# Patient Record
Sex: Female | Born: 2000 | Race: White | Hispanic: No | Marital: Single | Smoking: Current every day smoker
Health system: Southern US, Community
[De-identification: ages and names within clinical notes are randomized; demographics above are authoritative.]

## PROBLEM LIST (undated history)

## (undated) DIAGNOSIS — N926 Irregular menstruation, unspecified: Secondary | ICD-10-CM

## (undated) DIAGNOSIS — L709 Acne, unspecified: Secondary | ICD-10-CM

## (undated) HISTORY — DX: Irregular menstruation, unspecified: N92.6

## (undated) HISTORY — PX: FOOT SURGERY: SHX648

## (undated) HISTORY — DX: Acne, unspecified: L70.9

## (undated) HISTORY — PX: WISDOM TOOTH EXTRACTION: SHX21

---
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
## 2021-03-15 ENCOUNTER — Ambulatory Visit: Payer: 59 | Admitting: Women's Health

## 2021-05-03 ENCOUNTER — Telehealth: Payer: Self-pay | Admitting: Women's Health

## 2021-05-03 NOTE — Telephone Encounter (Signed)
Returned pt's call for clarification. Two identifiers used. Pt aware that her mother called this morning. Pt stated that she did not take all of the antibiotic and Flagyl prescribed for Chlamydia and BV. Pt stated she felt that she was having symptoms again. Considering that this was resulted and prescribed in July 2022, pt was told to make an appt for a nurse visit self swab to recheck her. Pt agreed and appt made for 05/07/21. Pt confirmed understanding.

## 2021-05-03 NOTE — Telephone Encounter (Signed)
Patient's mother called and said that her daughter didn't take her medicines right that Ms. Booker prescribed her and now her stomach is back hurting. Please advise.

## 2021-05-07 ENCOUNTER — Other Ambulatory Visit (INDEPENDENT_AMBULATORY_CARE_PROVIDER_SITE_OTHER): Payer: 59

## 2021-05-07 ENCOUNTER — Other Ambulatory Visit (HOSPITAL_COMMUNITY)
Admission: RE | Admit: 2021-05-07 | Discharge: 2021-05-07 | Disposition: A | Discharge status: Home / Self Care | Payer: 59 | Source: Ambulatory Visit | Attending: Obstetrics & Gynecology | Admitting: Obstetrics & Gynecology

## 2021-05-07 ENCOUNTER — Other Ambulatory Visit: Payer: Self-pay

## 2021-05-07 DIAGNOSIS — N898 Other specified noninflammatory disorders of vagina: Secondary | ICD-10-CM | POA: Diagnosis present

## 2021-05-07 NOTE — Progress Notes (Addendum)
   NURSE VISIT- VAGINITIS/STD  SUBJECTIVE:  Charlene Brewer is a 20 y.o. G0P0000 GYN patientfemale here for a vaginal swab for vaginitis screening, STD screen.  She reports the following symptoms: discharge described as clear and pain for 1 month. Denies abnormal vaginal bleeding, significant pelvic pain, fever, or UTI symptoms.  OBJECTIVE:  There were no vitals taken for this visit.  Appears well, in no apparent distress  ASSESSMENT: Vaginal swab for  vaginitis & STD screening  PLAN: Self-collected vaginal probe for Gonorrhea, Chlamydia, Trichomonas, Bacterial Vaginosis, Yeast sent to lab Treatment: to be determined once results are received Follow-up as needed if symptoms persist/worsen, or new symptoms develop  Elazar Argabright A Kesler Wickham  05/07/2021 10:22 AM   Chart reviewed for nurse visit. Agree with plan of care.  Cheral Marker, [place] 05/07/2021 11:58 AM

## 2021-05-08 ENCOUNTER — Other Ambulatory Visit: Payer: Self-pay | Admitting: Women's Health

## 2021-05-08 LAB — CERVICOVAGINAL ANCILLARY ONLY
Bacterial Vaginitis (gardnerella): POSITIVE — AB
Candida Glabrata: NEGATIVE
Candida Vaginitis: NEGATIVE
Chlamydia: NEGATIVE
Comment: NEGATIVE
Comment: NEGATIVE
Comment: NEGATIVE
Comment: NEGATIVE
Comment: NEGATIVE
Comment: NORMAL
Neisseria Gonorrhea: NEGATIVE
Trichomonas: NEGATIVE

## 2021-05-08 MED ORDER — METRONIDAZOLE 500 MG PO TABS: 500.0000 mg | ORAL_TABLET | Freq: Two times a day (BID) | ORAL | 0 refills | Status: DC

## 2021-05-10 ENCOUNTER — Telehealth: Payer: Self-pay | Admitting: *Deleted

## 2021-05-10 NOTE — Telephone Encounter (Addendum)
Voice mail not set up @ 10:30 am. JSY  Noted, to continue trying Cheral Marker, CNM, [facility] 05/10/2021 12:34 PM

## 2021-05-10 NOTE — Telephone Encounter (Signed)
Pt aware swab came back + for BV and med was sent to pharmacy. Take med as directed. No sex or alcohol while on med. Pt voiced understanding. JSY

## 2021-08-19 ENCOUNTER — Other Ambulatory Visit: Payer: Self-pay

## 2021-08-19 ENCOUNTER — Ambulatory Visit (INDEPENDENT_AMBULATORY_CARE_PROVIDER_SITE_OTHER): Payer: 59 | Admitting: Adult Health

## 2021-08-19 ENCOUNTER — Encounter: Payer: Self-pay | Admitting: Adult Health

## 2021-08-19 VITALS — BP 105/59 | HR 74 | Ht 61.0 in | Wt 111.0 lb

## 2021-08-19 DIAGNOSIS — Z3202 Encounter for pregnancy test, result negative: Secondary | ICD-10-CM | POA: Diagnosis not present

## 2021-08-19 DIAGNOSIS — Z30011 Encounter for initial prescription of contraceptive pills: Secondary | ICD-10-CM | POA: Diagnosis not present

## 2021-08-19 DIAGNOSIS — Z113 Encounter for screening for infections with a predominantly sexual mode of transmission: Secondary | ICD-10-CM | POA: Diagnosis not present

## 2021-08-19 LAB — POCT URINE PREGNANCY: Preg Test, Ur: NEGATIVE

## 2021-08-19 MED ORDER — NORETHIN ACE-ETH ESTRAD-FE 1-20 MG-MCG(24) PO TABS: 1.0000 | ORAL_TABLET | Freq: Every day | ORAL | 11 refills | Status: DC

## 2021-08-19 NOTE — Progress Notes (Signed)
?  Subjective:  ?  ? Patient ID: Charlene Brewer, female   DOB: 04-Feb-2001, 21 y.o.   MRN: [id number] ? ?HPI ?Charlene Brewer is a 21 year old white female, single, G0P0 in requesting to get back on OCs she liked lo Loestrin but was too expensive, has tried th patch and did not like and has been on one other OC, not sure of name. ?PCP is Dr Theresia Bough Nogo. ? ?Review of Systems ?Patient denies any headaches, hearing loss, fatigue, blurred vision, shortness of breath, chest pain, abdominal pain, problems with bowel movements, urination, or intercourse. No joint pain or mood swings.  ?  Reviewed past medical,surgical, social and family history. Reviewed medications and allergies.  ?Objective:  ? Physical Exam ?BP (!) 105/59 (BP Location: Right Arm, Patient Position: Sitting, Cuff Size: Normal)   Pulse 74   Ht 5\' 1"  (1.549 m)   Wt 111 lb (50.3 kg)   LMP 08/12/2021 (Exact Date)   BMI 20.97 kg/m?  UPT is negative ?Skin warm and dry.  Lungs: clear to ausculation bilaterally. Cardiovascular: regular rate and rhythm.  ?   ? Upstream - 08/19/21 0858   ? ?  ? Pregnancy Intention Screening  ? Does the patient want to become pregnant in the next year? No   ? Does the patient's partner want to become pregnant in the next year? No   ? Would the patient like to discuss contraceptive options today? Yes   ?  ? Contraception Wrap Up  ? Current Method No Contraceptive Precautions   ? End Method Oral Contraceptive   ? Contraception Counseling Provided Yes   ? ?  ?  ? ?  ?  ?Assessment:  ?   ?1. Pregnancy test negative ? ? ?2. Screen for STD (sexually transmitted disease ?Urine sent for GC/CHL ? ?3. Encounter for initial prescription of contraceptive pills ?Will rx Loestrin 24 1/20,start today and use condoms for 1 pack  ?Meds ordered this encounter  ?Medications  ? Norethindrone Acetate-Ethinyl Estrad-FE (LOESTRIN 24 FE) 1-20 MG-MCG(24) tablet  ?  Sig: Take 1 tablet by mouth daily.  ?  Dispense:  28 tablet  ?  Refill:  11  ?  Order Specific  Question:   Supervising Provider  ?  Answer:   Tania Ade H [[id number]]  ?  ?   ?Plan:  ?   ?Follow up in 3 months  ?   ?

## 2021-08-21 LAB — GC/CHLAMYDIA PROBE AMP
Chlamydia trachomatis, NAA: NEGATIVE
Neisseria Gonorrhoeae by PCR: NEGATIVE

## 2021-11-19 ENCOUNTER — Ambulatory Visit: Payer: 59 | Admitting: Adult Health

## 2022-01-09 IMAGING — CT CT ABD-PELV W/ CM
2 of 4 series · 16 of 46 positions shown, 18 images · IV contrast (Omnipaque or Isovue)
Comparison: None.

CLINICAL DATA: Right lower quadrant abdominal pain

EXAM:
CT ABDOMEN AND PELVIS WITH CONTRAST
TECHNIQUE: Multidetector CT imaging of the abdomen and pelvis was performed
using the standard protocol following bolus administration of
intravenous contrast.
CONTRAST:  75mL OMNIPAQUE IOHEXOL 300 MG/ML  SOLN

[Series 2: axial st · axial · 0.76mm/px · z∈[-875,-485]mm · 13 of 86 slices shown, 15 images]
[im 4/86  soft-tissue]
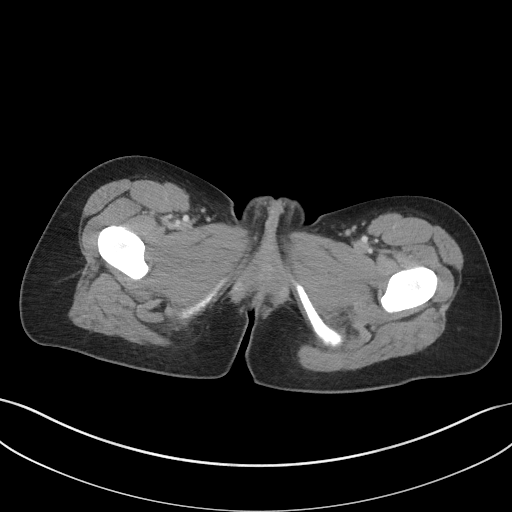
[im 4/86  bone]
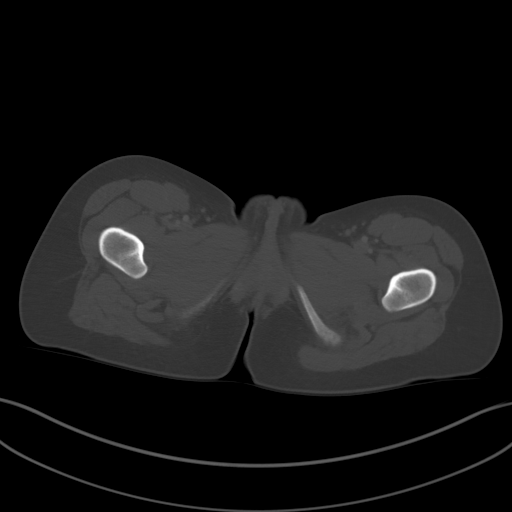
[im 11/86  soft-tissue]
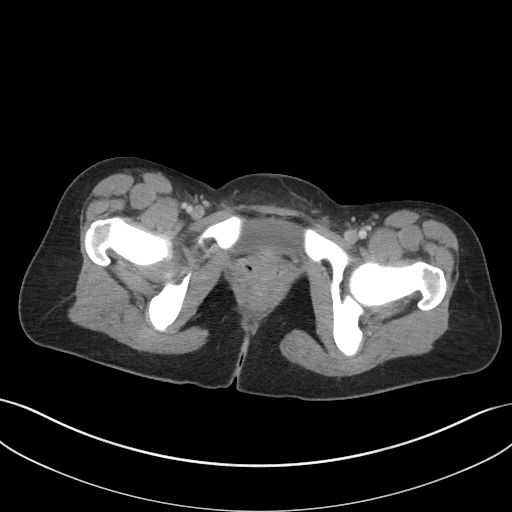
[im 18/86  soft-tissue]
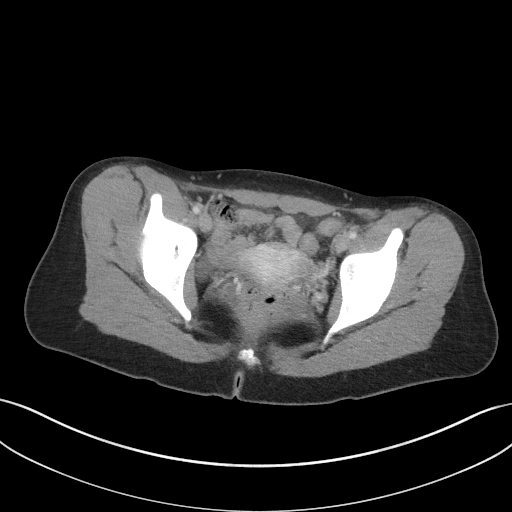
[im 24/86  soft-tissue]
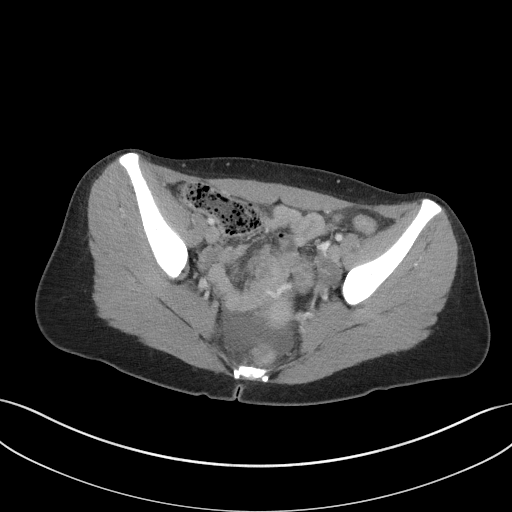
[im 31/86  soft-tissue]
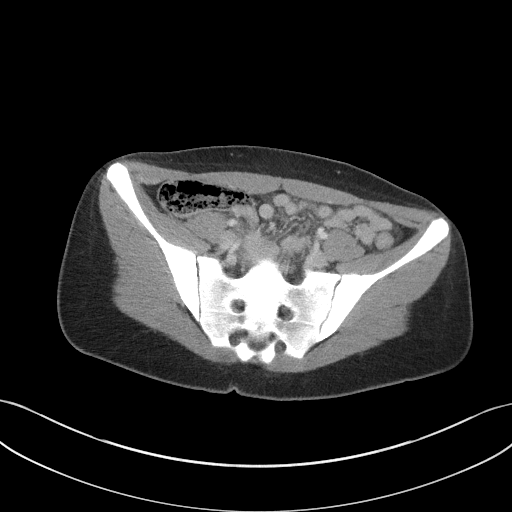
[im 38/86  soft-tissue]
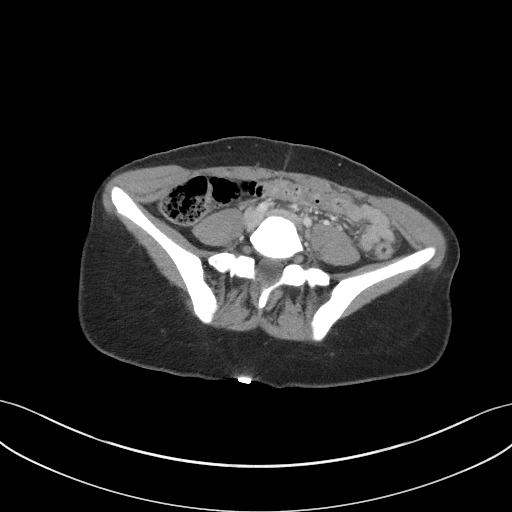
[im 45/86  soft-tissue]
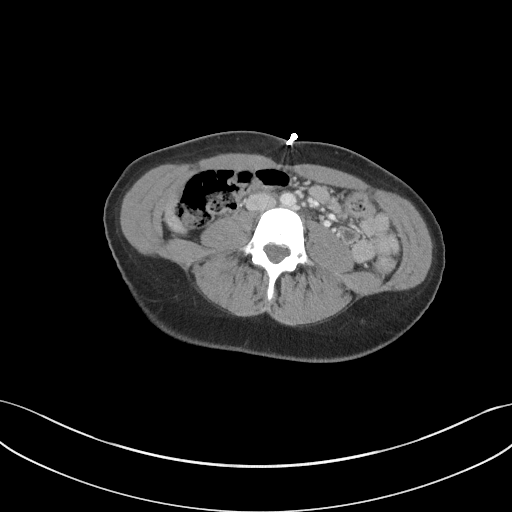
[im 48/86  soft-tissue]
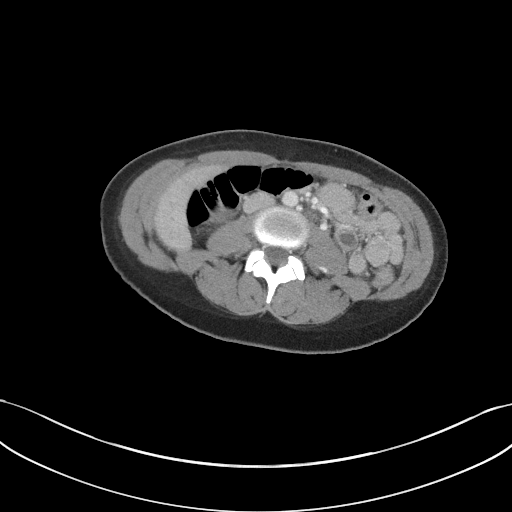
[im 55/86  soft-tissue]
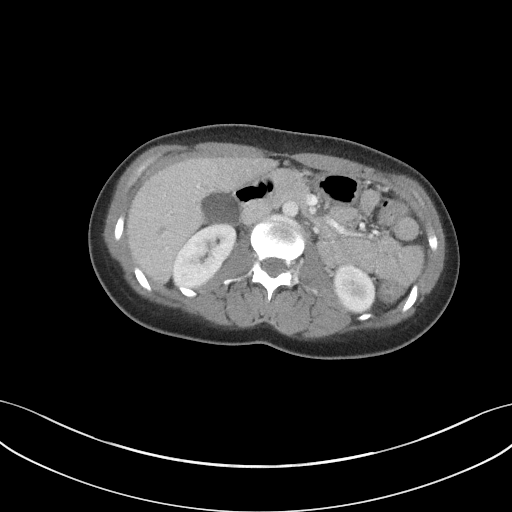
[im 55/86  bone]
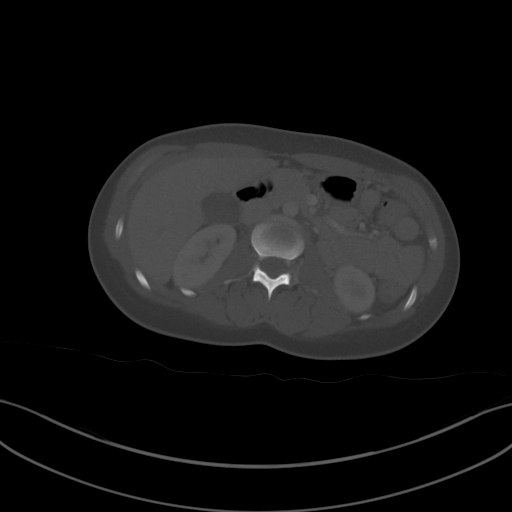
[im 62/86  soft-tissue]
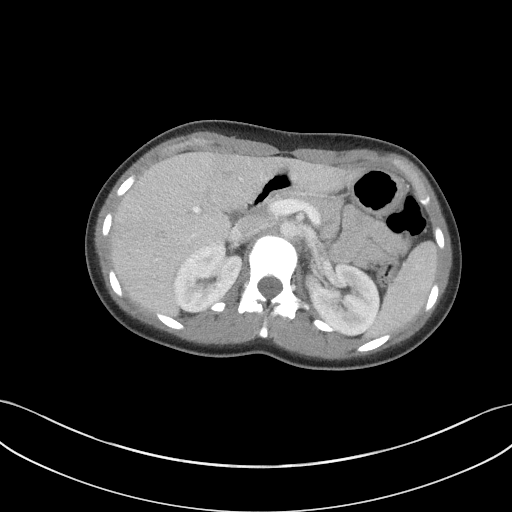
[im 69/86  soft-tissue]
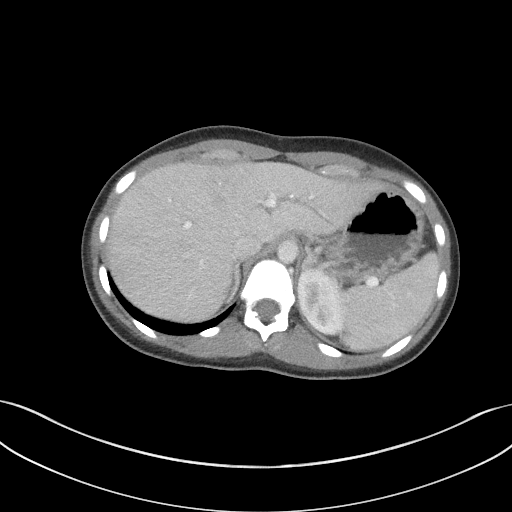
[im 75/86  soft-tissue]
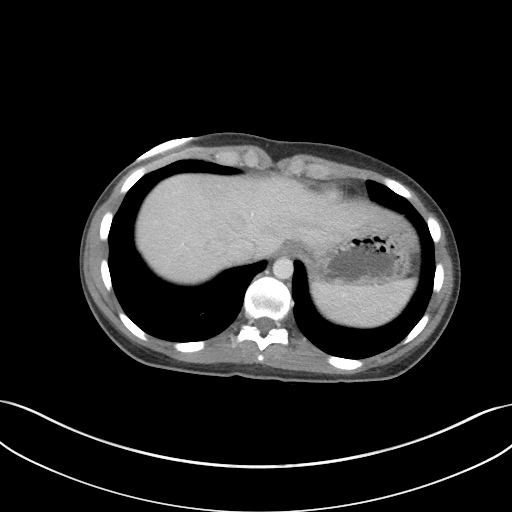
[im 82/86  soft-tissue]
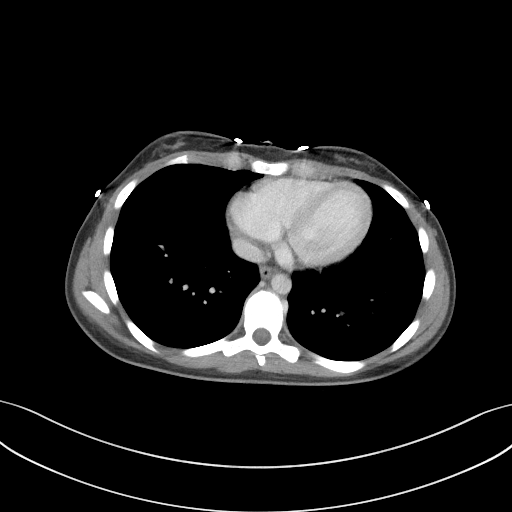

[Series 5: coronal st · coronal · 0.68mm/px · 3 of 82 slices shown]
[im 28/82  soft-tissue]
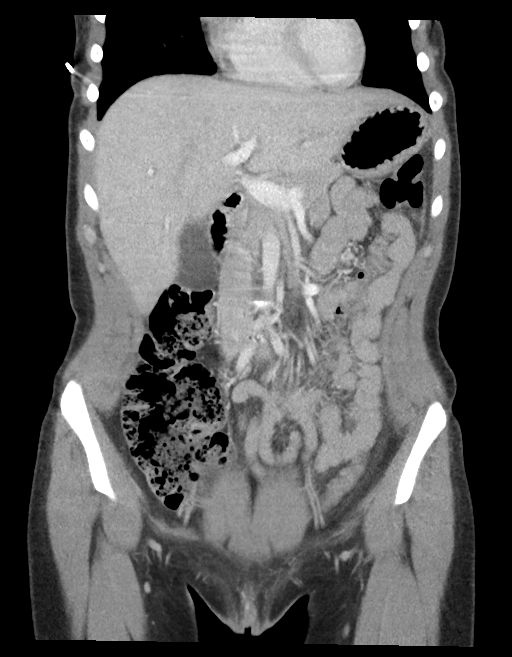
[im 37/82  soft-tissue]
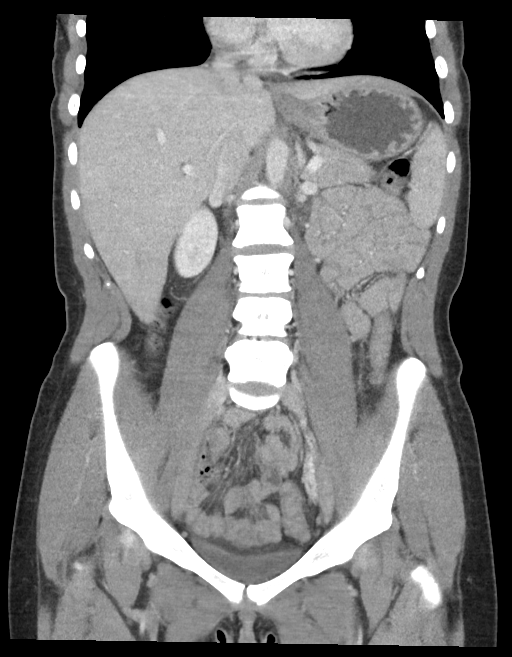
[im 46/82  soft-tissue]
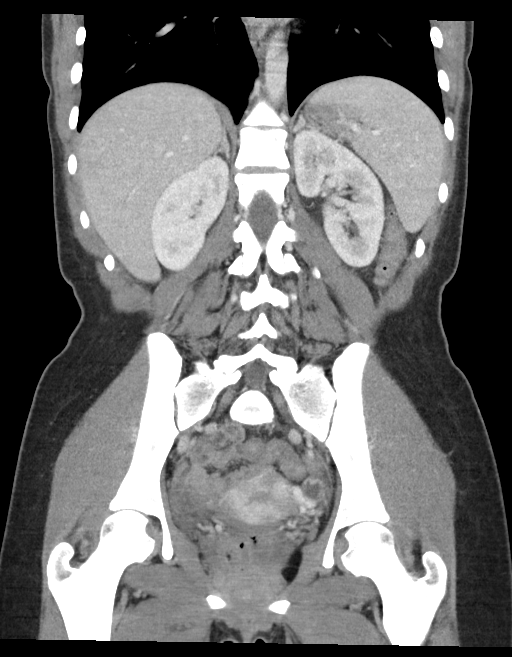

[16 of 46 positions shown; findings below may reference images not displayed]

FINDINGS: Lower chest: No acute abnormality.

Hepatobiliary: No focal liver abnormality is seen. No gallstones,
gallbladder wall thickening, or biliary dilatation.

Pancreas: Unremarkable. No pancreatic ductal dilatation or
surrounding inflammatory changes.

Spleen: Normal in size without focal abnormality.

Adrenals/Urinary Tract: Normal adrenal glands. Tiny cyst within
posterior cortex of left kidney measures 6 mm. No mass or
hydronephrosis identified. Urinary bladder appears normal for degree
of distension.

Stomach/Bowel: Normal stomach. Portions of the appendix are
identified within the right side of pelvis, image 39/5. No signs of
appendiceal thickening or inflammation. No bowel wall thickening,
inflammation, or distension.

Vascular/Lymphatic: No significant vascular findings are present. No
enlarged abdominal or pelvic lymph nodes.

Reproductive: Uterus appears normal. Left ovary corpus luteum, image
65/2. Prominent bilateral parametrial veins identified, left greater
than right.

Other: Small volume of free fluid noted within the dependent portion
of the pelvis, image 63/2. No focal fluid collections.

Musculoskeletal: No acute or significant osseous findings.
IMPRESSION: 1. No acute findings identified within the abdomen or pelvis. The
appendix is visualized and appears normal.
2. Small volume of free fluid noted within the dependent portion of
the pelvis. Nonspecific in a premenopausal female.
3. Prominent bilateral parametrial veins, left greater than right.
Correlate for any clinical signs or symptoms of pelvic congestion
syndrome.

## 2022-05-19 ENCOUNTER — Telehealth: Payer: Self-pay

## 2022-05-19 MED ORDER — NORETHIN ACE-ETH ESTRAD-FE 1-20 MG-MCG(24) PO TABS: 1.0000 | ORAL_TABLET | Freq: Every day | ORAL | 11 refills | Status: DC

## 2022-05-19 NOTE — Telephone Encounter (Signed)
Pt needs a refill on birth control. Thanks! JSY 

## 2022-05-19 NOTE — Telephone Encounter (Signed)
Refill sent.

## 2022-05-19 NOTE — Telephone Encounter (Signed)
Patient stated that she was in a few months ago and was given birth control to see if she liked them. She does and wants a refill

## 2022-06-15 ENCOUNTER — Other Ambulatory Visit: Payer: Self-pay | Admitting: Adult Health

## 2023-04-20 ENCOUNTER — Other Ambulatory Visit: Payer: Self-pay | Admitting: Adult Health

## 2023-05-22 ENCOUNTER — Ambulatory Visit
Admission: EM | Admit: 2023-05-22 | Discharge: 2023-05-22 | Disposition: A | Discharge status: Home / Self Care | Payer: 59

## 2023-05-22 DIAGNOSIS — J0100 Acute maxillary sinusitis, unspecified: Secondary | ICD-10-CM | POA: Diagnosis not present

## 2023-05-22 MED ORDER — PROMETHAZINE-DM 6.25-15 MG/5ML PO SYRP: 5.0000 mL | ORAL_SOLUTION | Freq: Four times a day (QID) | ORAL | 0 refills | Status: AC | PRN

## 2023-05-22 MED ORDER — AMOXICILLIN-POT CLAVULANATE 875-125 MG PO TABS: 1.0000 | ORAL_TABLET | Freq: Two times a day (BID) | ORAL | 0 refills | Status: AC

## 2023-05-22 NOTE — ED Provider Notes (Signed)
[facility]    CSN: [id number] Arrival date & time: 05/22/23  0806      History   Chief Complaint Chief Complaint  Patient presents with   Cough    HPI Charlene Brewer is a 22 y.o. female.   The history is provided by the patient.   The patient presents with a 2-week history of nasal congestion, chest congestion, cough, and postnasal drainage.  Patient denies fever, chills, headache, ear pain, ear drainage, wheezing, difficulty breathing, chest pain, abdominal pain, nausea, vomiting, diarrhea, or rash.  Patient states cough is worse at night.  Reports she has been taking NyQuil and Mucinex for symptoms, but with minimal relief.  She denies any obvious known sick contacts.  Past Medical History:  Diagnosis Date   Acne    Irregular periods     Patient Active Problem List   Diagnosis Date Noted   Encounter for initial prescription of contraceptive pills 08/19/2021   Screen for STD (sexually transmitted disease) 08/19/2021   Pregnancy test negative 08/19/2021   Chlamydia 12/17/2020   Positive depression screening 03/31/2019   Irregular periods 07/21/2017    Past Surgical History:  Procedure Laterality Date   WISDOM TOOTH EXTRACTION      OB History     Gravida  0   Para  0   Term  0   Preterm  0   AB  0   Living  0      SAB  0   IAB  0   Ectopic  0   Multiple  0   Live Births  0            Home Medications    Prior to Admission medications   Medication Sig Start Date End Date Taking? Authorizing Provider  amoxicillin-clavulanate (AUGMENTIN) 875-125 MG tablet Take 1 tablet by mouth every 12 (twelve) hours. 05/22/23  Yes Leath-Warren, Sadie Haber, NP  promethazine-dextromethorphan (PROMETHAZINE-DM) 6.25-15 MG/5ML syrup Take 5 mLs by mouth 4 (four) times daily as needed. 05/22/23  Yes Leath-Warren, Sadie Haber, NP  doxycycline (VIBRAMYCIN) 100 MG capsule Take 1 capsule (100 mg total) by mouth 2 (two) times daily. X 7 days Patient  taking differently: Take 100 mg by mouth 2 (two) times daily. 12/17/20   Cheral Marker, CNM  escitalopram (LEXAPRO) 10 MG tablet Take by mouth.    [provider]  Norethindrone Acetate-Ethinyl Estrad-FE (AUROVELA 24 FE) 1-20 MG-MCG(24) tablet TAKE 1 TABLET BY MOUTH DAILY 04/20/23   Adline Potter, NP    Family History Family History  Problem Relation Age of Onset   Hypertension Mother    Hypertension Father    GER disease Neg Hx    Irritable bowel syndrome Neg Hx    Food intolerance Neg Hx     Social History Social History   Tobacco Use   Smoking status: Every Day    Types: E-cigarettes   Smokeless tobacco: Never  Vaping Use   Vaping status: Every Day   Substances: Nicotine  Substance Use Topics   Alcohol use: Yes   Drug use: Yes    Types: Marijuana     Allergies   Patient has no known allergies.   Review of Systems Review of Systems Per HPI  Physical Exam Triage Vital Signs ED Triage Vitals  Encounter Vitals Group     BP 05/22/23 0818 108/74     Systolic BP Percentile --      Diastolic BP Percentile --  Pulse Rate 05/22/23 0818 83     Resp 05/22/23 0818 18     Temp 05/22/23 0818 98 F (36.7 C)     Temp Source 05/22/23 0818 Oral     SpO2 05/22/23 0818 99 %     Weight --      Height --      Head Circumference --      Peak Flow --      Pain Score 05/22/23 0819 0     Pain Loc --      Pain Education --      Exclude from Growth Chart --    No data found.  Updated Vital Signs BP 108/74 (BP Location: Right Arm)   Pulse 83   Temp 98 F (36.7 C) (Oral)   Resp 18   LMP 05/15/2023   SpO2 99%   Visual Acuity Right Eye Distance:   Left Eye Distance:   Bilateral Distance:    Right Eye Near:   Left Eye Near:    Bilateral Near:     Physical Exam Vitals and nursing note reviewed.  Constitutional:      General: She is not in acute distress.    Appearance: Normal appearance.  HENT:     Head: Normocephalic.     Right Ear:  Tympanic membrane, ear canal and external ear normal.     Left Ear: Tympanic membrane, ear canal and external ear normal.     Nose: Congestion present.     Right Turbinates: Enlarged and swollen.     Left Turbinates: Enlarged and swollen.     Right Sinus: Maxillary sinus tenderness present.     Left Sinus: Maxillary sinus tenderness present.     Mouth/Throat:     Lips: Pink.     Mouth: Mucous membranes are moist.     Pharynx: Uvula midline. Posterior oropharyngeal erythema and postnasal drip present. No pharyngeal swelling or oropharyngeal exudate.     Comments: Cobblestoning present to posterior oropharynx  Eyes:     Extraocular Movements: Extraocular movements intact.     Conjunctiva/sclera: Conjunctivae normal.     Pupils: Pupils are equal, round, and reactive to light.  Cardiovascular:     Rate and Rhythm: Normal rate and regular rhythm.     Pulses: Normal pulses.     Heart sounds: Normal heart sounds.  Pulmonary:     Effort: Pulmonary effort is normal. No respiratory distress.     Breath sounds: Normal breath sounds. No stridor. No wheezing, rhonchi or rales.  Abdominal:     General: Bowel sounds are normal.     Palpations: Abdomen is soft.     Tenderness: There is no abdominal tenderness.  Musculoskeletal:     Cervical back: Normal range of motion.  Lymphadenopathy:     Cervical: No cervical adenopathy.  Skin:    General: Skin is warm and dry.  Neurological:     General: No focal deficit present.     Mental Status: She is alert and oriented to person, place, and time.  Psychiatric:        Mood and Affect: Mood normal.        Behavior: Behavior normal.      UC Treatments / Results  Labs (all labs ordered are listed, but only abnormal results are displayed) Labs Reviewed - No data to display  EKG   Radiology No results found.  Procedures Procedures (including critical care time)  Medications Ordered in UC Medications - No data to display  Initial  Impression / Assessment and Plan / UC Course  I have reviewed the triage vital signs and the nursing notes.  Pertinent labs & imaging results that were available during my care of the patient were reviewed by me and considered in my medical decision making (see chart for details).  On exam, lung sounds are clear throughout, room air sats at 99%.  No indication for imaging.  Will treat for maxillary sinusitis in the presence of maxillary facial tenderness, and symptoms that have persisted for more than 2 weeks.  Will treat for acute maxillary sinusitis with Augmentin 875/125 mg and Promethazine DM for the cough.  Supportive care recommendations were provided and discussed with the patient to include over-the-counter analgesics, fluids, rest, normal saline nasal spray, and use of a humidifier.  Discussed indications with the patient regarding when follow-up will be necessary.  Patient was in agreement with this plan of care and verbalized understanding.  All questions were answered.  Patient stable for discharge.   Final Clinical Impressions(s) / UC Diagnoses   Final diagnoses:  Acute maxillary sinusitis, recurrence not specified     Discharge Instructions      Take medication as directed. Increase fluids and get plenty of rest. May take over-the-counter ibuprofen or Tylenol as needed for pain, fever, or general discomfort. Recommend normal saline nasal spray to help with nasal congestion throughout the day. For your cough, it may be helpful to use a humidifier at bedtime during sleep. If symptoms do not improve with this treatment, you may follow-up in this clinic or with your primary care physician for further evaluation. Follow-up as needed.     ED Prescriptions     Medication Sig Dispense Auth. Provider   amoxicillin-clavulanate (AUGMENTIN) 875-125 MG tablet Take 1 tablet by mouth every 12 (twelve) hours. 14 tablet Leath-Warren, Sadie Haber, NP   promethazine-dextromethorphan  (PROMETHAZINE-DM) 6.25-15 MG/5ML syrup Take 5 mLs by mouth 4 (four) times daily as needed. 118 mL Leath-Warren, Sadie Haber, NP      PDMP not reviewed this encounter.   Abran Cantor, NP 05/22/23 [phone]

## 2023-05-22 NOTE — Discharge Instructions (Signed)
 Take medication as directed. Increase fluids and get plenty of rest. May take over-the-counter ibuprofen or Tylenol as needed for pain, fever, or general discomfort. Recommend normal saline nasal spray to help with nasal congestion throughout the day. For your cough, it may be helpful to use a humidifier at bedtime during sleep. If symptoms do not improve with this treatment, you may follow-up in this clinic or with your primary care physician for further evaluation. Follow-up as needed.

## 2023-05-22 NOTE — ED Triage Notes (Signed)
Pt reports she has a cough, chest congestion,  and nasal congestion x 2 weeks   States she took nyquil and mucinex for relief

## 2023-08-08 ENCOUNTER — Other Ambulatory Visit: Payer: Self-pay | Admitting: Adult Health

## 2023-11-30 ENCOUNTER — Other Ambulatory Visit: Payer: Self-pay | Admitting: Adult Health

## 2023-12-26 ENCOUNTER — Other Ambulatory Visit: Payer: Self-pay | Admitting: Adult Health

## 2024-01-01 ENCOUNTER — Other Ambulatory Visit: Payer: Self-pay | Admitting: Adult Health

## 2024-02-16 ENCOUNTER — Other Ambulatory Visit: Payer: Self-pay | Admitting: Adult Health

## 2024-05-13 ENCOUNTER — Other Ambulatory Visit: Payer: Self-pay | Admitting: Adult Health
